# Patient Record
Sex: Male | Born: 1950 | Hispanic: No | Marital: Married | Smoking: Never smoker
Health system: Southern US, Community
[De-identification: ages and names within clinical notes are randomized; demographics above are authoritative.]

## PROBLEM LIST (undated history)

## (undated) DIAGNOSIS — E079 Disorder of thyroid, unspecified: Secondary | ICD-10-CM

## (undated) DIAGNOSIS — E7800 Pure hypercholesterolemia, unspecified: Secondary | ICD-10-CM

---

## 2017-07-30 ENCOUNTER — Emergency Department (HOSPITAL_BASED_OUTPATIENT_CLINIC_OR_DEPARTMENT_OTHER)
Admission: EM | Admit: 2017-07-30 | Discharge: 2017-07-30 | Disposition: A | Discharge status: Home / Self Care | Payer: Medicare Other | Attending: Emergency Medicine | Admitting: Emergency Medicine

## 2017-07-30 ENCOUNTER — Encounter (HOSPITAL_BASED_OUTPATIENT_CLINIC_OR_DEPARTMENT_OTHER): Payer: Self-pay | Admitting: *Deleted

## 2017-07-30 DIAGNOSIS — H9202 Otalgia, left ear: Secondary | ICD-10-CM | POA: Diagnosis present

## 2017-07-30 DIAGNOSIS — Z79899 Other long term (current) drug therapy: Secondary | ICD-10-CM | POA: Diagnosis not present

## 2017-07-30 DIAGNOSIS — G500 Trigeminal neuralgia: Secondary | ICD-10-CM | POA: Diagnosis not present

## 2017-07-30 HISTORY — DX: Disorder of thyroid, unspecified: E07.9

## 2017-07-30 HISTORY — DX: Pure hypercholesterolemia, unspecified: E78.00

## 2017-07-30 MED ORDER — BACLOFEN 20 MG PO TABS: 10.0000 mg | ORAL_TABLET | Freq: Three times a day (TID) | ORAL | 1 refills | Status: DC

## 2017-07-30 MED ORDER — HYDROCODONE-ACETAMINOPHEN 5-325 MG PO TABS: 2.0000 | ORAL_TABLET | ORAL | 0 refills | Status: DC | PRN

## 2017-07-30 MED ORDER — MELOXICAM 15 MG PO TABS: 15.0000 mg | ORAL_TABLET | Freq: Every day | ORAL | 0 refills | Status: DC

## 2017-07-30 MED FILL — BACLOFEN 20 MG TABLET: 20 | 20 days supply | Qty: 60 | Fill #0

## 2017-07-30 MED FILL — HYDROCODON-APAP 5-325: 5-325 | 2 days supply | Qty: 10 | Fill #0

## 2017-07-30 MED FILL — MELOXICAM 15 MG TABLET: 15 | 15 days supply | Qty: 15 | Fill #0

## 2017-07-30 NOTE — ED Triage Notes (Signed)
Pain in his left ear and left side of his face. He has been on antibiotic therapy x 3 with slight improvement in the pain but not completely relieved. He states only drinking water makes the pain subside.

## 2017-07-30 NOTE — ED Provider Notes (Signed)
[facility] Provider [facility]   CSN: [id number] Arrival date & time: 07/30/17  1533     History   Chief Complaint Chief Complaint  Patient presents with  . Otalgia    HPI Andrew Rodriguez is a 66 y.o. male to the emergency department with chief complaint of severe pain on the left side of his face. His symptoms began about 3 weeks ago after he got home from [place]. He has been evaluated 3 times for pain in his ear and has had 3 rounds of antibiotics with worsening pain. He complains of sharp severe 10 out of 10 stabbing shooting pain over the left cheek and down the left jaw. He states that it feels slightly improved when he sits [place]. He denies any tooth pain or sore throat. He denies headache or pain with chewing. He has no visual disturbances. The patient states that he has had ear pain consistent with eustachian tube dysfunction from flying before and the pain he is having today is nothing like that. He states he has never had any pain like this before. He denies any recent rashes. He saw a dentist who did an orthopantogram that showed no dental infections.  HPI  Past Medical History:  Diagnosis Date  . High cholesterol   . Thyroid disease     There are no active problems to display for this patient.   History reviewed. No pertinent surgical history.     Home Medications    Prior to Admission medications   Medication Sig Start Date End Date Taking? Authorizing Provider  AMOXICILLIN PO Take by mouth.   Yes [provider]  Levothyroxine Sodium (SYNTHROID PO) Take by mouth.   Yes [provider]  rosuvastatin (CRESTOR) 20 MG tablet Take 20 mg by mouth daily.   Yes [provider]  baclofen (LIORESAL) 20 MG tablet Take 0.5-1 tablets (10-20 mg total) by mouth 3 (three) times daily. 07/30/17   Arthor Captain, PA-C  HYDROcodone-acetaminophen (NORCO) 5-325 MG tablet Take 2 tablets by mouth every 4 (four) hours as needed.  07/30/17   Arthor Captain, PA-C  meloxicam (MOBIC) 15 MG tablet Take 1 tablet (15 mg total) by mouth daily. Take 1 daily with food. 07/30/17   Arthor Captain, PA-C    Family History No family history on file.  Social History Social History  Substance Use Topics  . Smoking status: Never Smoker  . Smokeless tobacco: Never Used  . Alcohol use No     Allergies   Patient has no known allergies.   Review of Systems Review of Systems Ten systems reviewed and are negative for acute change, except as noted in the HPI.    Physical Exam Updated Vital Signs BP 129/76   Pulse 66   Resp 18   Ht  (1.778 m)   Wt 88.5 kg (195 lb)   SpO2 98%   BMI 27.98 kg/m   Physical Exam  Constitutional: He appears well-developed and well-nourished. No distress.  HENT:  Head: Normocephalic and atraumatic.  No rashes or eruptions on the scalp. Normal external auricles bilaterally, canals and TMs without abnormality bilaterally.  Eyes: Conjunctivae are normal. No scleral icterus.  Neck: Normal range of motion. Neck supple.  Cardiovascular: Normal rate, regular rhythm and normal heart sounds.   Pulmonary/Chest: Effort normal and breath sounds normal. No respiratory distress.  Abdominal: Soft. There is no tenderness.  Musculoskeletal: He exhibits no edema.  Neurological: He is alert.  Speech is clear and goal oriented,  follows commands Major Cranial nerves without deficit, no facial droop Normal strength in upper and lower extremities bilaterally including dorsiflexion and plantar flexion, strong and equal grip strength Sensation normal to light and sharp touch Moves extremities without ataxia, coordination intact Normal finger to nose and rapid alternating movements Neg romberg, no pronator drift Normal gait Normal heel-shin and balance   Skin: Skin is warm and dry. He is not diaphoretic.  Psychiatric: His behavior is normal.  Nursing note and vitals reviewed.    ED Treatments /  Results  Labs (all labs ordered are listed, but only abnormal results are displayed) Labs Reviewed - No data to display  EKG  EKG Interpretation None       Radiology No results found.  Procedures Procedures (including critical care time)  Medications Ordered in ED Medications - No data to display   Initial Impression / Assessment and Plan / ED Course  I have reviewed the triage vital signs and the nursing notes.  Pertinent labs & imaging results that were available during my care of the patient were reviewed by me and considered in my medical decision making (see chart for details).     Patient with severe 10 out of 10 sharp stabbing pain in the distribution of the middle and lower trigeminal nerve fibers. I believe the patient has trigeminal neuralgia. She does not appear to have infection in his ear. His teeth have been ruled out by a dentist. He does have infection in his oropharynx. He has no other neurologic abnormalities. I doubt temporal arteritis. No recent or current evidence of shingles. The patient will be given baclofen to treat his pain and outpatient neurology follow-up I discussed the case with Dr. Silverio Lay who agrees with work up and poc Final Clinical Impressions(s) / ED Diagnoses   Final diagnoses:  Trigeminal neuralgia of left side of face    New Prescriptions Discharge Medication List as of 07/30/2017  5:09 PM    START taking these medications   Details  baclofen (LIORESAL) 20 MG tablet Take 0.5-1 tablets (10-20 mg total) by mouth 3 (three) times daily., Starting Fri 07/30/2017, Print    HYDROcodone-acetaminophen (NORCO) 5-325 MG tablet Take 2 tablets by mouth every 4 (four) hours as needed., Starting Fri 07/30/2017, Print    meloxicam (MOBIC) 15 MG tablet Take 1 tablet (15 mg total) by mouth daily. Take 1 daily with food., Starting Fri 07/30/2017, Print         Tiburcio Pea [place], PA-C 07/30/17 1817    Charlynne Pander, MD 07/31/17 [phone]

## 2017-07-30 NOTE — Discharge Instructions (Signed)
Contact a health care provider if: Your pain medicine is not helping. You develop new, unexplained symptoms, such as: Double vision. Facial weakness. Changes in hearing or balance. You become pregnant.

## 2020-11-11 ENCOUNTER — Other Ambulatory Visit: Payer: Self-pay

## 2020-11-11 ENCOUNTER — Emergency Department (HOSPITAL_BASED_OUTPATIENT_CLINIC_OR_DEPARTMENT_OTHER): Payer: Medicare Other

## 2020-11-11 ENCOUNTER — Encounter (HOSPITAL_BASED_OUTPATIENT_CLINIC_OR_DEPARTMENT_OTHER): Payer: Self-pay | Admitting: *Deleted

## 2020-11-11 ENCOUNTER — Emergency Department (HOSPITAL_BASED_OUTPATIENT_CLINIC_OR_DEPARTMENT_OTHER)
Admission: EM | Admit: 2020-11-11 | Discharge: 2020-11-11 | Disposition: A | Discharge status: Home / Self Care | Payer: Medicare Other | Attending: Emergency Medicine | Admitting: Emergency Medicine

## 2020-11-11 DIAGNOSIS — M79601 Pain in right arm: Secondary | ICD-10-CM | POA: Diagnosis not present

## 2020-11-11 DIAGNOSIS — M542 Cervicalgia: Secondary | ICD-10-CM | POA: Insufficient documentation

## 2020-11-11 DIAGNOSIS — M25511 Pain in right shoulder: Secondary | ICD-10-CM | POA: Diagnosis present

## 2020-11-11 MED ORDER — MELOXICAM 7.5 MG PO TABS: 7.5000 mg | ORAL_TABLET | Freq: Every day | ORAL | 0 refills | Status: AC

## 2020-11-11 NOTE — Discharge Instructions (Signed)
Please read and follow all provided instructions.  Your diagnoses today include:  1. Acute pain of right shoulder     Tests performed today include:  An x-ray of the affected area - does NOT show any broken bones  Vital signs. See below for your results today.   Medications prescribed:   Meloxicam - anti-inflammatory pain medication  You have been prescribed an anti-inflammatory medication or NSAID. Take with food. Do not take aspirin, ibuprofen, or naproxen if taking this medication. Take smallest effective dose for the shortest duration needed for your pain. Stop taking if you experience stomach pain or vomiting.   Take any prescribed medications only as directed.  Home care instructions:   Follow any educational materials contained in this packet  Follow R.I.C.E. Protocol:  R - rest your injury   I  - use ice on injury without applying directly to skin  C - compress injury with bandage or splint  E - elevate the injury as much as possible  Follow-up instructions: Please follow-up with your primary care provider or the provided orthopedic physician (bone specialist) if you continue to have significant pain in 1 week. In this case you may have a more severe injury that requires further care.   Return instructions:   Please return if your fingers are numb or tingling, appear gray or blue, or you have severe pain (also elevate the arm and loosen splint or wrap if you were given one)  Please return to the Emergency Department if you experience worsening symptoms.   Please return if you have any other emergent concerns.  Additional Information:  Your vital signs today were: BP (!) 159/87 (BP Location: Left Arm)   Pulse 69   Temp 98.6 F (37 C) (Oral)   Resp 20   Ht 5\' 11"  (1.803 m)   Wt 90.7 kg   SpO2 98%   BMI 27.89 kg/m  If your blood pressure (BP) was elevated above 135/85 this visit, please have this repeated by your doctor within one  month. --------------

## 2020-11-11 NOTE — ED Triage Notes (Signed)
Patient was working 0n the ceiling on Saturday and right shoulder pain started.  He thought he was pushing the electrical wire so hard.

## 2020-11-11 NOTE — ED Provider Notes (Signed)
[facility] EMERGENCY DEPARTMENT Provider Note   CSN: [id number] Arrival date & time: 11/11/20  1418     History Chief Complaint  Patient presents with  . Shoulder Pain    Andrew Rodriguez is a 70 y.o. male.  Patient presents to the hospital today for evaluation of right shoulder pain. Symptoms started after using a hammer 2 days ago. He states that he was hammering something that was above shoulder level. Pain began that night. It has been intermittent since that time. It is made worse with heat and better with cold. He has taken naproxen without much improvement. Pain radiates from the lateral neck and shoulder into the upper arm. No chest pain, shortness of breath or trouble breathing. Denies any decreased range of motion even with the pain. No numbness or tingling into the arms or lower extremities.        Past Medical History:  Diagnosis Date  . High cholesterol   . Thyroid disease     There are no problems to display for this patient.   History reviewed. No pertinent surgical history.     History reviewed. No pertinent family history.  Social History   Tobacco Use  . Smoking status: Never Smoker  . Smokeless tobacco: Never Used  Substance Use Topics  . Alcohol use: No  . Drug use: No    Home Medications Prior to Admission medications   Medication Sig Start Date End Date Taking? Authorizing Provider  Levothyroxine Sodium (SYNTHROID PO) Take by mouth.   Yes [provider]  meloxicam (MOBIC) 7.5 MG tablet Take 1 tablet (7.5 mg total) by mouth daily. 11/11/20  Yes Renne Crigler, PA-C  rosuvastatin (CRESTOR) 20 MG tablet Take 20 mg by mouth daily.   Yes [provider]    Allergies    Patient has no known allergies.  Review of Systems   Review of Systems  Constitutional: Negative for activity change.  Musculoskeletal: Positive for arthralgias, myalgias and neck pain (lateral). Negative for back pain, gait problem  and joint swelling.  Skin: Negative for wound.  Neurological: Negative for weakness and numbness.    Physical Exam Updated Vital Signs BP (!) 159/87 (BP Location: Left Arm)   Pulse 69   Temp 98.6 F (37 C) (Oral)   Resp 20   Ht 5\' 11"  (1.803 m)   Wt 90.7 kg   SpO2 98%   BMI 27.89 kg/m   Physical Exam Vitals and nursing note reviewed.  Constitutional:      Appearance: He is well-developed and well-nourished.  HENT:     Head: Normocephalic and atraumatic.  Eyes:     Conjunctiva/sclera: Conjunctivae normal.  Cardiovascular:     Pulses: Normal pulses. No decreased pulses.  Musculoskeletal:        General: Tenderness present. No edema.     Right shoulder: No swelling or bony tenderness. Normal range of motion.     Cervical back: Normal range of motion and neck supple.     Comments: 2+ right radial pulse. Distal sensation normal.  Skin:    General: Skin is warm and dry.  Neurological:     Mental Status: He is alert.     Sensory: No sensory deficit.     Comments: Motor, sensation, and vascular distal to the injury is fully intact.   Psychiatric:        Mood and Affect: Mood and affect normal.     ED Results / Procedures / Treatments   Labs (all  labs ordered are listed, but only abnormal results are displayed) Labs Reviewed - No data to display  EKG None  Radiology DG Shoulder Right  Result Date: 11/11/2020 CLINICAL DATA:  Pain, injury EXAM: RIGHT SHOULDER - 2+ VIEW COMPARISON:  None. FINDINGS: No fracture or malalignment. Mild AC joint degenerative change. Slightly narrowed appearing subacromial space. IMPRESSION: No acute osseous abnormality. Slightly narrowed subacromial space, possible rotator cuff disease. Electronically Signed   By: Jasmine Pang M.D.   On: 11/11/2020 16:26    Procedures Procedures (including critical care time)  Medications Ordered in ED Medications - No data to display  ED Course  I have reviewed the triage vital signs and the nursing  notes.  Pertinent labs & imaging results that were available during my care of the patient were reviewed by me and considered in my medical decision making (see chart for details).  Patient seen and examined. X-ray performed and is negative for acute injuries. Patient will be given meloxicam to take for a week. He is given sports medicine follow-up. At time of exam, he actually does not have any pain. He has full range of motion of his shoulder. No numbness or tingling distally and has normal radial pulses. Plan for continued symptomatic treatment and follow-up as needed.  Vital signs reviewed and are as follows: BP (!) 159/87 (BP Location: Left Arm)   Pulse 69   Temp 98.6 F (37 C) (Oral)   Resp 20   Ht 5\' 11"  (1.803 m)   Wt 90.7 kg   SpO2 98%   BMI 27.89 kg/m       MDM Rules/Calculators/A&P                          Patient with shoulder injury. Negative x-ray. Upper extremity is neurovascularly intact with normal distal pulses and sensation. No active pain at time of exam. Plan as above.   Final Clinical Impression(s) / ED Diagnoses Final diagnoses:  Acute pain of right shoulder    Rx / DC Orders ED Discharge Orders         Ordered    meloxicam (MOBIC) 7.5 MG tablet  Daily        11/11/20 1653           11/13/20, PA-C 11/11/20 1709    11/13/20, MD 11/11/20 1725

## 2020-11-11 NOTE — ED Notes (Signed)
Pt reports pain in the right shoulder after using a heavy hammer.  Pt states that the only relief he gets is if he is in the cold air or cold muscle rub.

## 2020-11-14 ENCOUNTER — Telehealth: Payer: Self-pay | Admitting: Family Medicine

## 2020-11-14 NOTE — Telephone Encounter (Signed)
Called pt to offer ED follow up appt w/provider --no answer,message left.  -glh

## 2022-03-09 IMAGING — DX DG SHOULDER 2+V*R*
3 series · 3 of 3 positions shown · non-contrast
Comparison: None.

CLINICAL DATA: Pain, injury

EXAM:
RIGHT SHOULDER - 2+ VIEW

[shoulder grashey]
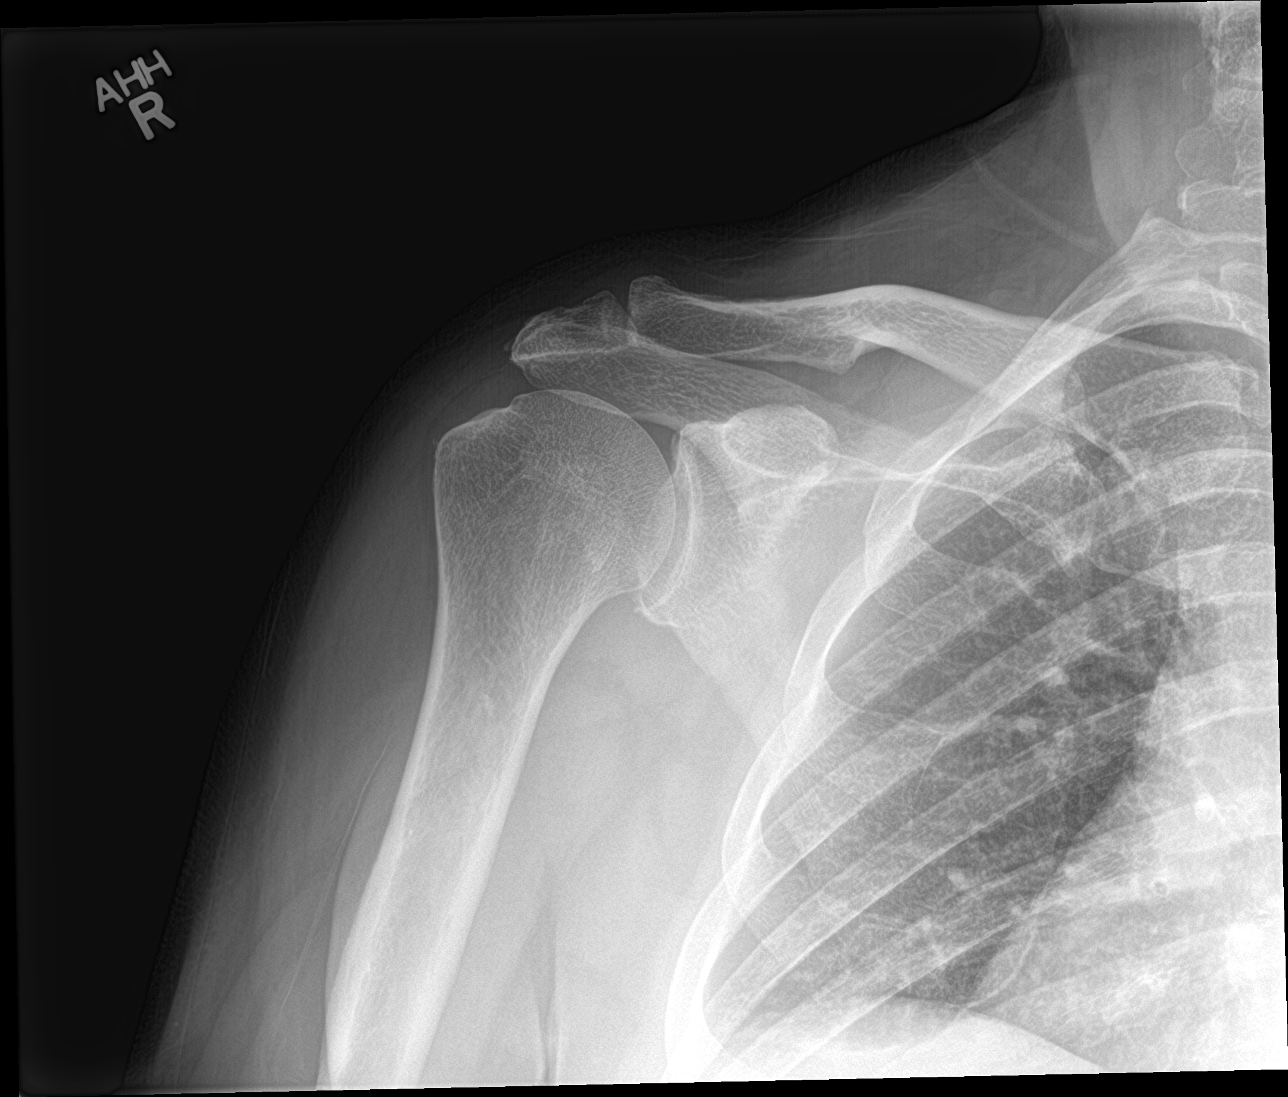

[shoulder y view]
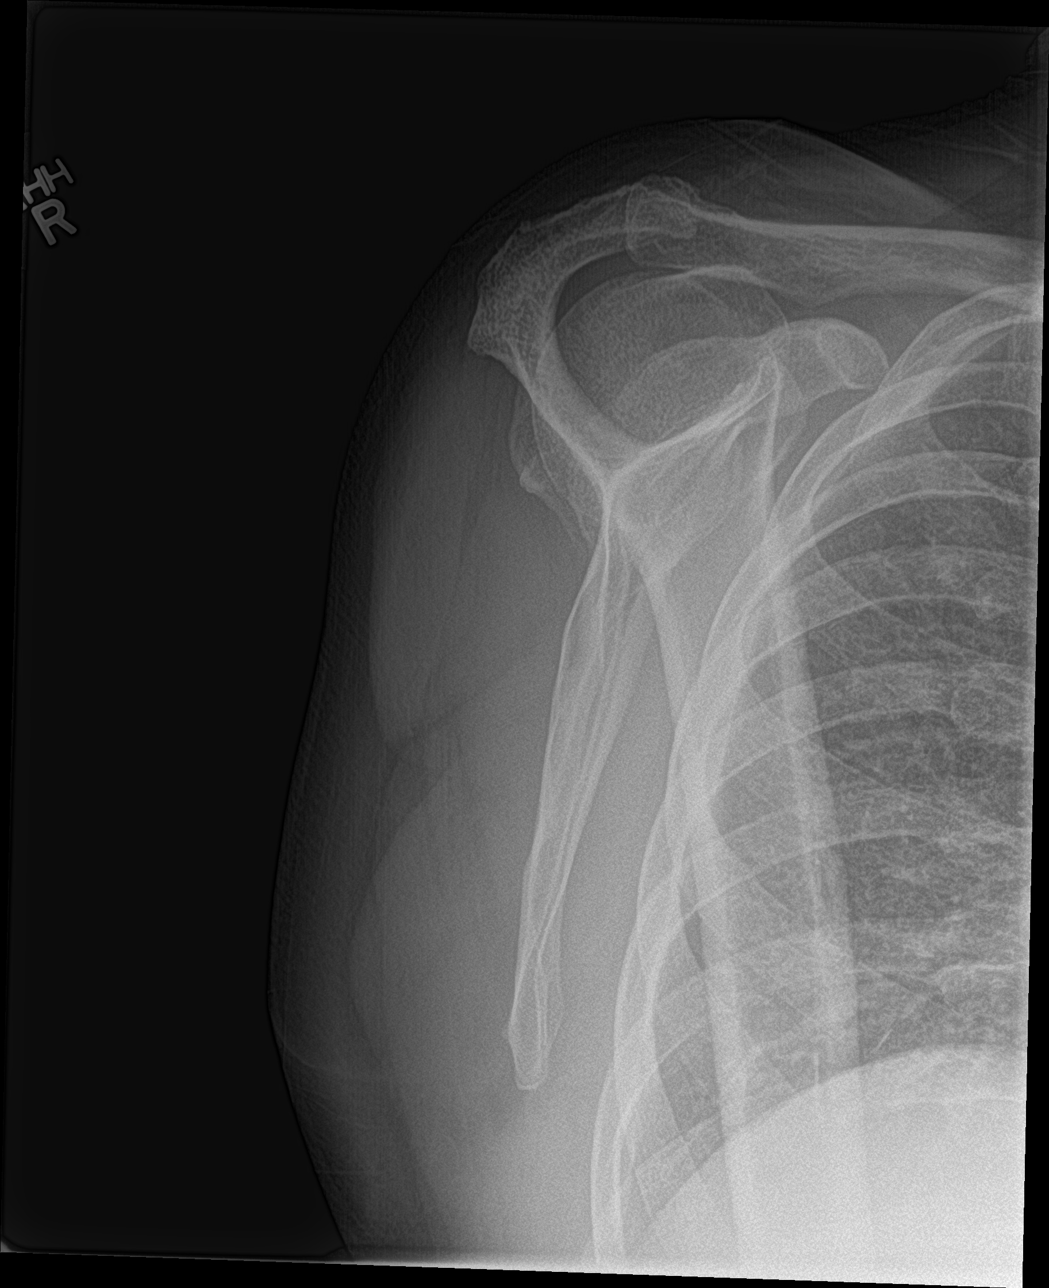

[shoulder axillary]
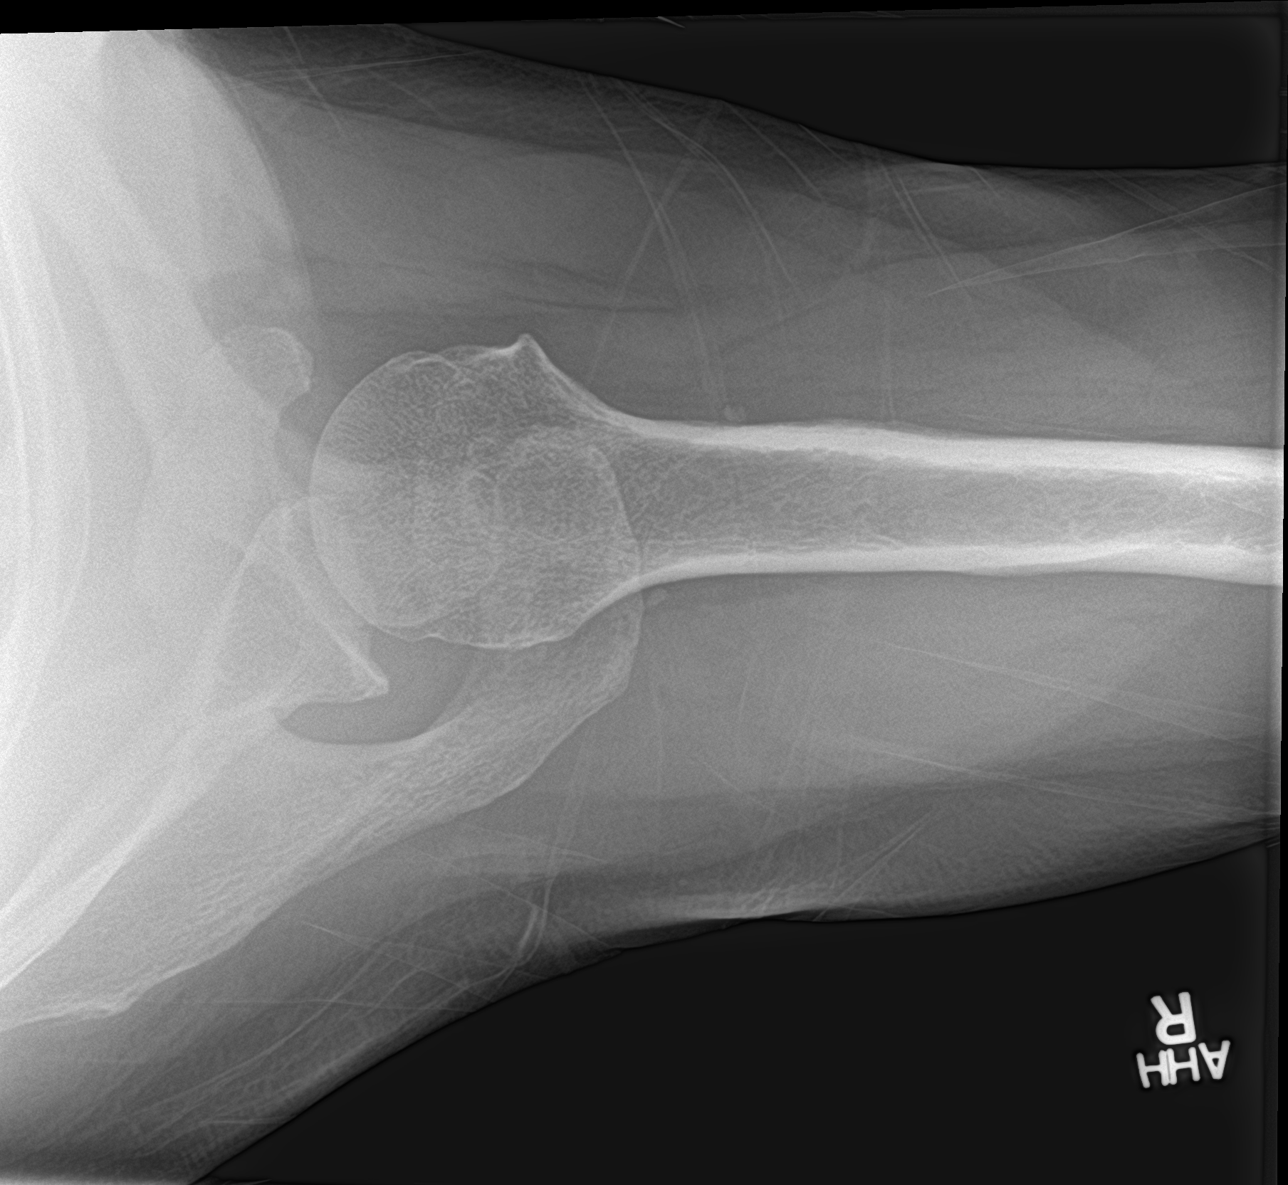

[3 of 3 positions shown; findings below may reference images not displayed]

FINDINGS: No fracture or malalignment. Mild AC joint degenerative change.
Slightly narrowed appearing subacromial space.
IMPRESSION: No acute osseous abnormality. Slightly narrowed subacromial space,
possible rotator cuff disease.

## 2023-02-11 ENCOUNTER — Encounter: Payer: Self-pay | Admitting: *Deleted
